# Patient Record
Sex: Female | Born: 1980 | Hispanic: No | Marital: Married | Smoking: Never smoker
Health system: Southern US, Community
[De-identification: ages and names within clinical notes are randomized; demographics above are authoritative.]

## PROBLEM LIST (undated history)

## (undated) ENCOUNTER — Inpatient Hospital Stay (HOSPITAL_COMMUNITY): Payer: Self-pay

## (undated) DIAGNOSIS — Z789 Other specified health status: Secondary | ICD-10-CM

## (undated) HISTORY — PX: NO PAST SURGERIES: SHX2092

---

## 2004-10-24 ENCOUNTER — Inpatient Hospital Stay (HOSPITAL_COMMUNITY): Admission: AD | Admit: 2004-10-24 | Discharge: 2004-10-24 | Payer: Self-pay | Admitting: Obstetrics

## 2004-10-26 ENCOUNTER — Inpatient Hospital Stay (HOSPITAL_COMMUNITY): Admission: AD | Admit: 2004-10-26 | Discharge: 2004-10-28 | Payer: Self-pay | Admitting: Obstetrics

## 2012-04-25 ENCOUNTER — Inpatient Hospital Stay (HOSPITAL_COMMUNITY): Payer: Medicaid Other

## 2012-04-25 ENCOUNTER — Encounter (HOSPITAL_COMMUNITY): Payer: Self-pay | Admitting: *Deleted

## 2012-04-25 ENCOUNTER — Inpatient Hospital Stay (HOSPITAL_COMMUNITY)
Admission: AD | Admit: 2012-04-25 | Discharge: 2012-04-25 | Disposition: A | Discharge status: Home / Self Care | Payer: Medicaid Other | Source: Ambulatory Visit | Attending: Obstetrics and Gynecology | Admitting: Obstetrics and Gynecology

## 2012-04-25 DIAGNOSIS — O039 Complete or unspecified spontaneous abortion without complication: Secondary | ICD-10-CM | POA: Insufficient documentation

## 2012-04-25 DIAGNOSIS — O2620 Pregnancy care for patient with recurrent pregnancy loss, unspecified trimester: Secondary | ICD-10-CM | POA: Diagnosis present

## 2012-04-25 HISTORY — DX: Other specified health status: Z78.9

## 2012-04-25 LAB — CBC
HCT: 34.1 % — ABNORMAL LOW (ref 36.0–46.0)
Platelets: 202 10*3/uL (ref 150–400)
RDW: 14.9 % (ref 11.5–15.5)
WBC: 7.8 10*3/uL (ref 4.0–10.5)

## 2012-04-25 LAB — HCG, QUANTITATIVE, PREGNANCY: hCG, Beta Chain, Quant, S: 3947 m[IU]/mL — ABNORMAL HIGH (ref <5)

## 2012-04-25 LAB — ABO/RH: ABO/RH(D): A POS

## 2012-04-25 NOTE — MAU Provider Note (Signed)
Chief Complaint: Vaginal Bleeding and Miscarriage   None    SUBJECTIVE HPI: Amanda Beltran is a 31 y.o. G5P3013 at [redacted]w[redacted]d by LMP who presents to maternity admissions reporting heavy vaginal bleeding with clots and abdominal cramping x1 day, with spotting x2 days.  Two days ago she was seen in office and had no FHR on U/S.  She was told to come to MAU if she had heavy bleeding or pain.  She had a miscarriage 1.5 years ago and is concerned about why this is happening again.  She denies vaginal itching/burning, urinary symptoms, h/a, dizziness, n/v, or fever/chills.    After passing large clot in MAU, pt denies pain and vaginal bleeding decreased.  Past Medical History  Diagnosis Date  . No pertinent past medical history    Past Surgical History  Procedure Date  . No past surgeries    History   Social History  . Marital Status: Married    Spouse Name: N/A    Number of Children: N/A  . Years of Education: N/A   Occupational History  . Not on file.   Social History Main Topics  . Smoking status: Never Smoker   . Smokeless tobacco: Not on file  . Alcohol Use: No  . Drug Use: No  . Sexually Active:    Other Topics Concern  . Not on file   Social History Narrative  . No narrative on file   No current facility-administered medications on file prior to encounter.   No current outpatient prescriptions on file prior to encounter.   No Known Allergies  ROS: Pertinent items in HPI  OBJECTIVE Blood pressure 103/55, pulse 62, temperature 98.2 F (36.8 C), temperature source Oral, resp. rate 16, height 5\' 3"  (1.6 m), weight 63.05 kg (139 lb), last menstrual period 01/30/2012. GENERAL: Well-developed, well-nourished female in no acute distress.  HEENT: Normocephalic HEART: normal rate RESP: normal effort ABDOMEN: Soft, non-tender EXTREMITIES: Nontender, no edema NEURO: Alert and oriented SPECULUM EXAM: Deferred--done in office on Monday Cervix 0/long/high  LAB  RESULTS Results for orders placed during the hospital encounter of 04/25/12 (from the past 24 hour(s))  CBC     Status: Abnormal   Collection Time   04/25/12  6:05 AM      Component Value Range   WBC 7.8  4.0 - 10.5 K/uL   RBC 4.15  3.87 - 5.11 MIL/uL   Hemoglobin 11.0 (*) 12.0 - 15.0 g/dL   HCT 16.1 (*) 09.6 - 04.5 %   MCV 82.2  78.0 - 100.0 fL   MCH 26.5  26.0 - 34.0 pg   MCHC 32.3  30.0 - 36.0 g/dL   RDW 40.9  81.1 - 91.4 %   Platelets 202  150 - 400 K/uL  ABO/RH     Status: Normal   Collection Time   04/25/12  6:05 AM      Component Value Range   ABO/RH(D) A POS    HCG, QUANTITATIVE, PREGNANCY     Status: Abnormal   Collection Time   04/25/12  6:05 AM      Component Value Range   hCG, Beta Chain, Quant, S 3947 (*) <5 mIU/mL    IMAGING US Ob Comp Less 14 Wks  04/25/2012  *RADIOLOGY REPORT*  Clinical Data: Bleeding.  Cramping and.  No fetal heart rate on office ultrasound.  OBSTETRIC <14 WK [place] AND TRANSVAGINAL OB US  Technique:  Both transabdominal and transvaginal ultrasound examinations were performed for complete evaluation of the  gestation as well as the maternal uterus, adnexal regions, and pelvic cul-de-sac.  Transvaginal technique was performed to assess early pregnancy.  Comparison:  None.  Intrauterine gestational sac:  Not visualized. Yolk sac: Not visualized. Embryo: Not visualized. Cardiac Activity: Not visualized.  Maternal uterus/adnexae: Endometrium measures 1.5 cm.  Complex fluid within the vagina.  The patient states she passed a large clot prior to ultrasound with pain relief.  The adnexa are visualized transabdominally and appear unremarkable.  IMPRESSION: No intrauterine gestation noted.  Findings in the proper clinical setting and laboratory setting (need to correlate with Beta HCG levels) consistent with a failed pregnancy.   Original Report Authenticated By: Lacy Duverney, M.D.    US Ob Transvaginal  04/25/2012  *RADIOLOGY REPORT*  Clinical Data: Bleeding.   Cramping and.  No fetal heart rate on office ultrasound.  OBSTETRIC <14 WK [place] AND TRANSVAGINAL OB US  Technique:  Both transabdominal and transvaginal ultrasound examinations were performed for complete evaluation of the gestation as well as the maternal uterus, adnexal regions, and pelvic cul-de-sac.  Transvaginal technique was performed to assess early pregnancy.  Comparison:  None.  Intrauterine gestational sac:  Not visualized. Yolk sac: Not visualized. Embryo: Not visualized. Cardiac Activity: Not visualized.  Maternal uterus/adnexae: Endometrium measures 1.5 cm.  Complex fluid within the vagina.  The patient states she passed a large clot prior to ultrasound with pain relief.  The adnexa are visualized transabdominally and appear unremarkable.  IMPRESSION: No intrauterine gestation noted.  Findings in the proper clinical setting and laboratory setting (need to correlate with Beta HCG levels) consistent with a failed pregnancy.   Original Report Authenticated By: Lacy Duverney, M.D.     ASSESSMENT 1. SAB (spontaneous abortion)     PLAN Called Dr Dion Body to discuss assessment and findings Large clot/products of conception sent to pathology Discharge home F/U in one week in the office Return to MAU as needed    Medication List     As of 04/25/2012  6:09 AM    ASK your doctor about these medications         ibuprofen 400 MG tablet   Commonly known as: ADVIL,MOTRIN   Take 400 mg by mouth every 6 (six) hours as needed.         Sharen Counter Certified Nurse-Midwife 04/25/2012  6:09 AM

## 2012-04-25 NOTE — MAU Note (Signed)
Pt LMP   01/30/2012, U/S 2 days ago, no fetal heart rate.  Bleeding started 1 wk ago, became heavier on Wednesday, passing clots.  Severe cramping, took ibuprofen at 0500.

## 2013-02-28 ENCOUNTER — Encounter (HOSPITAL_COMMUNITY): Payer: Self-pay | Admitting: *Deleted

## 2013-05-15 NOTE — L&D Delivery Note (Signed)
Delivery Note At 6:16 PM a viable female was delivered via  Vaginal delivery (Presentation: ; Occiput Anterior  ).  APGAR:8 , 9; weight . Pending   Placenta status: intact  , . 3 VC  Cord:  Without  complications: .  Cord pH: NA  Anesthesia: Epidural  Episiotomy: None  Lacerations:2nd degree perineal   Suture Repair: 3.0 vicryl Est. Blood Loss (mL): 300 ML  Mom to postpartum.  Baby to Couplet care / Skin to Skin.  Marcena Dias J. 01/05/2014, 6:39 PM

## 2013-06-04 ENCOUNTER — Encounter: Payer: Self-pay | Admitting: *Deleted

## 2013-06-05 ENCOUNTER — Ambulatory Visit (INDEPENDENT_AMBULATORY_CARE_PROVIDER_SITE_OTHER): Payer: BC Managed Care – PPO | Admitting: Obstetrics & Gynecology

## 2013-06-05 ENCOUNTER — Encounter: Payer: Self-pay | Admitting: Obstetrics & Gynecology

## 2013-06-05 VITALS — BP 94/60 | Temp 97.4°F | Wt 142.1 lb

## 2013-06-05 DIAGNOSIS — Z3490 Encounter for supervision of normal pregnancy, unspecified, unspecified trimester: Secondary | ICD-10-CM

## 2013-06-05 DIAGNOSIS — Z3480 Encounter for supervision of other normal pregnancy, unspecified trimester: Secondary | ICD-10-CM

## 2013-06-05 LAB — POCT URINALYSIS DIP (DEVICE)
BILIRUBIN URINE: NEGATIVE
Glucose, UA: NEGATIVE mg/dL
HGB URINE DIPSTICK: NEGATIVE
KETONES UR: NEGATIVE mg/dL
Leukocytes, UA: NEGATIVE
Nitrite: NEGATIVE
PH: 6 (ref 5.0–8.0)
PROTEIN: NEGATIVE mg/dL
SPECIFIC GRAVITY, URINE: 1.01 (ref 1.005–1.030)
Urobilinogen, UA: 0.2 mg/dL (ref 0.0–1.0)

## 2013-06-05 NOTE — Patient Instructions (Signed)
[facility] for any [facility] concerns or go to MAU for evaluation

## 2013-06-05 NOTE — Progress Notes (Signed)
Patient wants female providers only.  She was informed that this cannot be guaranteed, as we have female attending and fellows and residents.  The chances are high that she will have a female provider, but if one was not available in the hospital, a female provider will not be called in from home.  Patient is not satisfied with this, wants a practice with no female providers.  She was informed of other practices and their female providers; she wants [facility] which for now has female providers only, but cross-cover call with [facility] which is another option discussed with her (Dr. Stefano GaulStringer is only female partner).  Also talked about [facility], they only have one female attending.  [facility], we helped in making an appointment for her on 06/09/13 at 0930.   Patient was able to see fetus on bedside scan today; unable to auscultate HR with doppler.  Positive cardiac acitivity on ultrasound. Patient was reassured, she is anxious given her history of SABs.   No other complaints or concerns.  Routine obstetric precautions reviewed.

## 2013-06-05 NOTE — Progress Notes (Signed)
I assisted patient with making appt with [facility]. Appt made on Monday Jan26 at 0930.

## 2013-06-05 NOTE — Progress Notes (Signed)
P=  71, Here for initial OB visit. Not sure of date of LMP, but went to [facility] 05/25/13 for spotting - worried about miscarriage because history 2 miscarriages.  Said doctor in ER told her she was 3 months 2 days on 05/25/13 based on ultrasound there. C/o constipation sometimes. Last pap 3 years. Given new patient information . Discussed BMI/ appropriate weight gain.

## 2013-06-09 LAB — OB RESULTS CONSOLE ABO/RH: RH TYPE: POSITIVE

## 2013-06-09 LAB — OB RESULTS CONSOLE HEPATITIS B SURFACE ANTIGEN: HEP B S AG: NEGATIVE

## 2013-06-09 LAB — OB RESULTS CONSOLE HIV ANTIBODY (ROUTINE TESTING): HIV: NONREACTIVE

## 2013-06-09 LAB — OB RESULTS CONSOLE GC/CHLAMYDIA
CHLAMYDIA, DNA PROBE: NEGATIVE
Gonorrhea: NEGATIVE

## 2013-06-09 LAB — OB RESULTS CONSOLE RPR: RPR: NONREACTIVE

## 2013-06-09 LAB — OB RESULTS CONSOLE ANTIBODY SCREEN: Antibody Screen: NEGATIVE

## 2013-07-11 ENCOUNTER — Other Ambulatory Visit: Payer: Self-pay

## 2013-07-31 ENCOUNTER — Other Ambulatory Visit (HOSPITAL_COMMUNITY): Payer: Self-pay | Admitting: Obstetrics and Gynecology

## 2013-07-31 DIAGNOSIS — O280 Abnormal hematological finding on antenatal screening of mother: Secondary | ICD-10-CM

## 2013-08-01 ENCOUNTER — Ambulatory Visit (HOSPITAL_COMMUNITY)
Admission: RE | Admit: 2013-08-01 | Discharge: 2013-08-01 | Disposition: A | Discharge status: Home / Self Care | Payer: BC Managed Care – PPO | Source: Ambulatory Visit | Attending: Obstetrics and Gynecology | Admitting: Obstetrics and Gynecology

## 2013-08-01 ENCOUNTER — Encounter (HOSPITAL_COMMUNITY): Payer: Self-pay

## 2013-08-01 ENCOUNTER — Other Ambulatory Visit: Payer: Self-pay

## 2013-08-01 DIAGNOSIS — O289 Unspecified abnormal findings on antenatal screening of mother: Secondary | ICD-10-CM | POA: Insufficient documentation

## 2013-08-01 DIAGNOSIS — IMO0002 Reserved for concepts with insufficient information to code with codable children: Secondary | ICD-10-CM | POA: Insufficient documentation

## 2013-08-01 DIAGNOSIS — O3510X0 Maternal care for (suspected) chromosomal abnormality in fetus, unspecified, not applicable or unspecified: Secondary | ICD-10-CM | POA: Insufficient documentation

## 2013-08-01 DIAGNOSIS — O351XX0 Maternal care for (suspected) chromosomal abnormality in fetus, not applicable or unspecified: Secondary | ICD-10-CM | POA: Insufficient documentation

## 2013-08-01 DIAGNOSIS — O280 Abnormal hematological finding on antenatal screening of mother: Secondary | ICD-10-CM

## 2013-08-01 NOTE — Progress Notes (Signed)
Genetic Counseling  High-Risk Gestation Note  Appointment Date:  08/01/2013 Referred By: Jessee Aversole, Tara J., MD Date of Birth:  01/30/1981 Partner:  Penni HomansAyaz Ahmed   Pregnancy History: [id number]: G7P3033 Estimated Date of Delivery: 01/02/14 Estimated Gestational Age: 5535w0d Attending: Particia Nearingecker, Martha, MD   Mrs. Annice PihShabeena Goodhart and her husband, Mr. Penni Homansyaz Ahmed, were seen for genetic counseling because of an increased risk for fetal Down syndrome based on maternal serum Quad screen through [facility].  They were counseled regarding the Quad screen result and the associated 1 in 32 risk for fetal Down syndrome.  We reviewed chromosomes, nondisjunction, and the common features and variable prognosis of Down syndrome.  In addition, we reviewed the screen adjusted reduction in risks for trisomy 18 and ONTDs.  We also discussed other explanations for a screen positive result including: a gestational dating error, differences in maternal metabolism, and normal variation. They understand that this screening is not diagnostic for Down syndrome.  Mrs. Rana SnareKhan's Quad screen result was calculated using an EDC of 12/24/13 based on her LMP.  Given that Mrs. Welton FlakesKhan had 8 week ([facility]) and 12 week [facility] ultrasounds, which were consistent with an EDC of 01/02/14, the result was recalculated.  An addended report was sent to the referring provider.  The recalculated Quad screen was still screen positive for Down syndrome, with a risk of 1 in 166.  The patient and her husband were counseled regarding this recalculated result.    We reviewed available screening options including noninvasive prenatal screening (NIPS)/cell free fetal DNA (cffDNA) testing, and detailed ultrasound.  They were counseled that screening tests are used to modify a patient's a priori risk for aneuploidy. This estimate provides a pregnancy specific risk assessment. We reviewed the benefits and limitations of each option. Specifically, we discussed the conditions for which  each test screens, the detection rates, and false positive rates of each. They were also counseled regarding diagnostic testing via amniocentesis. We reviewed the approximate 1 in 300-500 risk for complications for amniocentesis, including spontaneous pregnancy loss.  After consideration of all the options, they elected to proceed with NIPS.  Those results will be available in 8-10 days.  The patient also expressed interest in having a detailed ultrasound.  A complete ultrasound was performed today. The ultrasound report will be sent under separate cover. There were no visualized fetal anomalies or markers suggestive of aneuploidy. Diagnostic testing was declined today.  They understand that screening tests cannot rule out all birth defects or genetic syndromes. The patient was advised of this limitation and states she still does not want additional testing at this time.   Considering this couple's Asian ancestry, we discussed the increased risk for hemoglobinopathies.  We reviewed the incidence of specific hemoglobinopathies and thalassemias, the availability of carrier testing, and prenatal diagnosis if indicated.  In addition, we discussed that hemoglobinopathies are routinely screened for as part of the [place] newborn screening panel.  This couple wishes to talk to their primary provider more about this testing option.     Both family histories were reviewed and found to be noncontributory for birth defects, intellectual disability, and known genetic conditions. Without further information regarding the provided family history, an accurate genetic risk cannot be calculated. Further genetic counseling is warranted if more information is obtained.  Mrs. Welton FlakesKhan denied exposure to environmental toxins or chemical agents. She denied the use of alcohol, tobacco or street drugs. She denied significant viral illnesses during the course of her pregnancy.   I counseled  this couple for approximately 35 minutes regarding  the above risks and available options.   Despina Arias, MS Certified Genetic Counselor

## 2013-08-13 ENCOUNTER — Telehealth (HOSPITAL_COMMUNITY): Payer: Self-pay

## 2013-08-13 NOTE — Telephone Encounter (Signed)
Called Amanda Beltran Cuellar to discuss her cell free fetal DNA test results.  Mrs. Amanda Beltran had Panorama testing through [facility].  Testing was offered because of an abnl Quad screen result.   The patient was identified by name and DOB.  We reviewed that these are within normal limits, showing a less than 1 in 10,000 risk for trisomies 21, 18 and 13, and monosomy X (Turner syndrome).  In addition, the risk for triploidy/vanishing twin and sex chromosome trisomies (47,XXX and 47,XXY) was also low risk.  We reviewed that this testing identifies > 99% of pregnancies with trisomy 7821, trisomy 3313, sex chromosome trisomies (47,XXX and 47,XXY), and triploidy. The detection rate for trisomy 18 is 96%.  The detection rate for monosomy X is ~92%.  The false positive rate is <0.1% for all conditions. Testing was also consistent with female gender.  The patient did not wish to know gender.  She understands that this testing does not identify all genetic conditions.  All questions were answered to her satisfaction, she was encouraged to call with additional questions or concerns.  Despina AriasSTANLEY, Berthe Oley, MS Certified Genetic Counselor

## 2013-12-09 LAB — OB RESULTS CONSOLE GBS: GBS: NEGATIVE

## 2014-01-02 ENCOUNTER — Telehealth (HOSPITAL_COMMUNITY): Payer: Self-pay | Admitting: *Deleted

## 2014-01-02 NOTE — Telephone Encounter (Signed)
Preadmission screen  

## 2014-01-05 ENCOUNTER — Encounter (HOSPITAL_COMMUNITY): Payer: BC Managed Care – PPO | Admitting: Anesthesiology

## 2014-01-05 ENCOUNTER — Inpatient Hospital Stay (HOSPITAL_COMMUNITY)
Admission: RE | Admit: 2014-01-05 | Discharge: 2014-01-07 | DRG: 775 | Disposition: A | Discharge status: Home / Self Care | Payer: BC Managed Care – PPO | Source: Ambulatory Visit | Attending: Obstetrics and Gynecology | Admitting: Obstetrics and Gynecology

## 2014-01-05 ENCOUNTER — Inpatient Hospital Stay (HOSPITAL_COMMUNITY): Payer: BC Managed Care – PPO | Admitting: Anesthesiology

## 2014-01-05 ENCOUNTER — Encounter (HOSPITAL_COMMUNITY): Payer: Self-pay

## 2014-01-05 DIAGNOSIS — D649 Anemia, unspecified: Secondary | ICD-10-CM | POA: Diagnosis present

## 2014-01-05 DIAGNOSIS — O9902 Anemia complicating childbirth: Secondary | ICD-10-CM | POA: Diagnosis present

## 2014-01-05 DIAGNOSIS — O289 Unspecified abnormal findings on antenatal screening of mother: Secondary | ICD-10-CM

## 2014-01-05 DIAGNOSIS — O480 Post-term pregnancy: Principal | ICD-10-CM | POA: Diagnosis present

## 2014-01-05 DIAGNOSIS — O2621 Pregnancy care for patient with recurrent pregnancy loss, first trimester: Secondary | ICD-10-CM

## 2014-01-05 LAB — CBC
HCT: 30.8 % — ABNORMAL LOW (ref 36.0–46.0)
HEMOGLOBIN: 9.7 g/dL — AB (ref 12.0–15.0)
MCH: 24.4 pg — AB (ref 26.0–34.0)
MCHC: 31.5 g/dL (ref 30.0–36.0)
MCV: 77.4 fL — AB (ref 78.0–100.0)
PLATELETS: 181 10*3/uL (ref 150–400)
RBC: 3.98 MIL/uL (ref 3.87–5.11)
RDW: 15.1 % (ref 11.5–15.5)
WBC: 7.1 10*3/uL (ref 4.0–10.5)

## 2014-01-05 LAB — RPR

## 2014-01-05 LAB — TYPE AND SCREEN
ABO/RH(D): A POS
Antibody Screen: NEGATIVE

## 2014-01-05 MED ORDER — FENTANYL 2.5 MCG/ML BUPIVACAINE 1/10 % EPIDURAL INFUSION (WH - ANES)
14.0000 mL/h | INTRAMUSCULAR | Status: DC | PRN
  Administered 2014-01-05 (×2): 14 mL/h via EPIDURAL
  Filled 2014-01-05: qty 125

## 2014-01-05 MED ORDER — OXYCODONE-ACETAMINOPHEN 5-325 MG PO TABS
1.0000 | ORAL_TABLET | ORAL | Status: DC | PRN
  Administered 2014-01-06 (×2): 1 via ORAL
  Filled 2014-01-05 (×2): qty 1

## 2014-01-05 MED ORDER — OXYTOCIN 40 UNITS IN LACTATED RINGERS INFUSION - SIMPLE MED
1.0000 m[IU]/min | INTRAVENOUS | Status: DC
  Administered 2014-01-05: 2 m[IU]/min via INTRAVENOUS
  Filled 2014-01-05: qty 1000

## 2014-01-05 MED ORDER — OXYTOCIN BOLUS FROM INFUSION: 500.0000 mL | INTRAVENOUS | Status: DC

## 2014-01-05 MED ORDER — IBUPROFEN 600 MG PO TABS: 600.0000 mg | ORAL_TABLET | Freq: Four times a day (QID) | ORAL | Status: DC | PRN

## 2014-01-05 MED ORDER — BENZOCAINE-MENTHOL 20-0.5 % EX AERO
1.0000 "application " | INHALATION_SPRAY | CUTANEOUS | Status: DC | PRN
  Administered 2014-01-05: 1 via TOPICAL
  Filled 2014-01-05: qty 56

## 2014-01-05 MED ORDER — PHENYLEPHRINE 40 MCG/ML (10ML) SYRINGE FOR IV PUSH (FOR BLOOD PRESSURE SUPPORT)
80.0000 ug | PREFILLED_SYRINGE | INTRAVENOUS | Status: DC | PRN
  Administered 2014-01-05: 80 ug via INTRAVENOUS
  Filled 2014-01-05: qty 2

## 2014-01-05 MED ORDER — TERBUTALINE SULFATE 1 MG/ML IJ SOLN: 0.2500 mg | Freq: Once | INTRAMUSCULAR | Status: DC | PRN

## 2014-01-05 MED ORDER — PRENATAL MULTIVITAMIN CH
1.0000 | ORAL_TABLET | Freq: Every day | ORAL | Status: DC
  Administered 2014-01-06 – 2014-01-07 (×2): 1 via ORAL
  Filled 2014-01-05 (×2): qty 1

## 2014-01-05 MED ORDER — LACTATED RINGERS IV SOLN: 500.0000 mL | Freq: Once | INTRAVENOUS | Status: DC

## 2014-01-05 MED ORDER — OXYTOCIN 40 UNITS IN LACTATED RINGERS INFUSION - SIMPLE MED: 62.5000 mL/h | INTRAVENOUS | Status: DC

## 2014-01-05 MED ORDER — FERROUS SULFATE 325 (65 FE) MG PO TABS
325.0000 mg | ORAL_TABLET | Freq: Two times a day (BID) | ORAL | Status: DC
  Administered 2014-01-06 – 2014-01-07 (×2): 325 mg via ORAL
  Filled 2014-01-05 (×2): qty 1

## 2014-01-05 MED ORDER — IBUPROFEN 600 MG PO TABS
600.0000 mg | ORAL_TABLET | Freq: Four times a day (QID) | ORAL | Status: DC
  Administered 2014-01-05 – 2014-01-07 (×6): 600 mg via ORAL
  Filled 2014-01-05 (×7): qty 1

## 2014-01-05 MED ORDER — PHENYLEPHRINE 40 MCG/ML (10ML) SYRINGE FOR IV PUSH (FOR BLOOD PRESSURE SUPPORT)
PREFILLED_SYRINGE | INTRAVENOUS | Status: AC
  Filled 2014-01-05: qty 10

## 2014-01-05 MED ORDER — FLEET ENEMA 7-19 GM/118ML RE ENEM: 1.0000 | ENEMA | RECTAL | Status: DC | PRN

## 2014-01-05 MED ORDER — ACETAMINOPHEN 325 MG PO TABS: 650.0000 mg | ORAL_TABLET | ORAL | Status: DC | PRN

## 2014-01-05 MED ORDER — FENTANYL 2.5 MCG/ML BUPIVACAINE 1/10 % EPIDURAL INFUSION (WH - ANES)
INTRAMUSCULAR | Status: AC
  Administered 2014-01-05: 12:00:00
  Filled 2014-01-05: qty 125

## 2014-01-05 MED ORDER — LIDOCAINE HCL (PF) 1 % IJ SOLN
30.0000 mL | INTRAMUSCULAR | Status: DC | PRN
  Filled 2014-01-05: qty 30

## 2014-01-05 MED ORDER — DIBUCAINE 1 % RE OINT: 1.0000 "application " | TOPICAL_OINTMENT | RECTAL | Status: DC | PRN

## 2014-01-05 MED ORDER — LACTATED RINGERS IV SOLN: 500.0000 mL | INTRAVENOUS | Status: DC | PRN

## 2014-01-05 MED ORDER — ZOLPIDEM TARTRATE 5 MG PO TABS: 5.0000 mg | ORAL_TABLET | Freq: Every evening | ORAL | Status: DC | PRN

## 2014-01-05 MED ORDER — METHYLERGONOVINE MALEATE 0.2 MG PO TABS: 0.2000 mg | ORAL_TABLET | ORAL | Status: DC | PRN

## 2014-01-05 MED ORDER — PHENYLEPHRINE 40 MCG/ML (10ML) SYRINGE FOR IV PUSH (FOR BLOOD PRESSURE SUPPORT)
80.0000 ug | PREFILLED_SYRINGE | INTRAVENOUS | Status: DC | PRN
  Filled 2014-01-05: qty 2

## 2014-01-05 MED ORDER — LACTATED RINGERS IV SOLN
INTRAVENOUS | Status: DC
  Administered 2014-01-05: 16:00:00 via INTRAVENOUS
  Administered 2014-01-05: 125 mL via INTRAVENOUS

## 2014-01-05 MED ORDER — ONDANSETRON HCL 4 MG/2ML IJ SOLN
4.0000 mg | Freq: Four times a day (QID) | INTRAMUSCULAR | Status: DC | PRN
  Administered 2014-01-05: 4 mg via INTRAVENOUS
  Filled 2014-01-05: qty 2

## 2014-01-05 MED ORDER — EPHEDRINE 5 MG/ML INJ
10.0000 mg | INTRAVENOUS | Status: DC | PRN
  Filled 2014-01-05: qty 2

## 2014-01-05 MED ORDER — SENNOSIDES-DOCUSATE SODIUM 8.6-50 MG PO TABS
2.0000 | ORAL_TABLET | ORAL | Status: DC
  Administered 2014-01-06 – 2014-01-07 (×2): 2 via ORAL
  Filled 2014-01-05 (×2): qty 2

## 2014-01-05 MED ORDER — CITRIC ACID-SODIUM CITRATE 334-500 MG/5ML PO SOLN: 30.0000 mL | ORAL | Status: DC | PRN

## 2014-01-05 MED ORDER — LANOLIN HYDROUS EX OINT
TOPICAL_OINTMENT | CUTANEOUS | Status: DC | PRN
Start: 2014-01-05 — End: 2014-01-07

## 2014-01-05 MED ORDER — DIPHENHYDRAMINE HCL 25 MG PO CAPS
25.0000 mg | ORAL_CAPSULE | Freq: Four times a day (QID) | ORAL | Status: DC | PRN
Start: 2014-01-05 — End: 2014-01-07

## 2014-01-05 MED ORDER — OXYCODONE-ACETAMINOPHEN 5-325 MG PO TABS: 1.0000 | ORAL_TABLET | ORAL | Status: DC | PRN

## 2014-01-05 MED ORDER — ONDANSETRON HCL 4 MG/2ML IJ SOLN: 4.0000 mg | INTRAMUSCULAR | Status: DC | PRN

## 2014-01-05 MED ORDER — ONDANSETRON HCL 4 MG PO TABS: 4.0000 mg | ORAL_TABLET | ORAL | Status: DC | PRN

## 2014-01-05 MED ORDER — SIMETHICONE 80 MG PO CHEW: 80.0000 mg | CHEWABLE_TABLET | ORAL | Status: DC | PRN

## 2014-01-05 MED ORDER — DIPHENHYDRAMINE HCL 50 MG/ML IJ SOLN: 12.5000 mg | INTRAMUSCULAR | Status: DC | PRN

## 2014-01-05 MED ORDER — METHYLERGONOVINE MALEATE 0.2 MG/ML IJ SOLN: 0.2000 mg | INTRAMUSCULAR | Status: DC | PRN

## 2014-01-05 MED ORDER — LIDOCAINE HCL (PF) 1 % IJ SOLN
INTRAMUSCULAR | Status: DC | PRN
  Administered 2014-01-05 (×3): 4 mL

## 2014-01-05 MED ORDER — WITCH HAZEL-GLYCERIN EX PADS
1.0000 | MEDICATED_PAD | CUTANEOUS | Status: DC | PRN
Start: 2014-01-05 — End: 2014-01-07
  Administered 2014-01-05: 1 via TOPICAL

## 2014-01-05 NOTE — Progress Notes (Signed)
Amanda Beltran is a 33 y.o. [id number] at [redacted]w[redacted]d by 12 week  ultrasound admitted for induction of labor due to Post dates. Due date 09/02/2013.  Subjective: Patient is comfortable with her epidural \  Objective: BP 104/66  Pulse 67  Temp(Src) 98.3 F (36.8 C) (Oral)  Resp 18  Ht  (1.6 m)  Wt 74.844 kg (165 lb)  BMI 29.24 kg/m2  SpO2 98%      FHT:  Baseline 120 moderate variability. Accelerations present. Variable decelerations  UC:   regular, every 2 minutes SVE:   Dilation: 8 Effacement (%): 100 Station: 0 Exam by:: [employer]: Lab Results  Component Value Date   WBC 7.1 01/05/2014   HGB 9.7* 01/05/2014   HCT 30.8* 01/05/2014   MCV 77.4* 01/05/2014   PLT 181 01/05/2014    Assessment / Plan: Induction of labor due to postterm,  progressing well on pitocin  Labor: Progressing on Pitocin Preeclampsia:  NA Fetal Wellbeing:  Category I Pain Control:  Epidural I/D:  n/a Anticipated MOD:  NSVD  Eryx Zane J. 01/05/2014, 4:51 PM

## 2014-01-05 NOTE — Anesthesia Preprocedure Evaluation (Signed)
Anesthesia Evaluation  Patient identified by MRN, date of birth, ID band Patient awake    Reviewed: Allergy & Precautions, H&P , NPO status , Patient's Chart, lab work & pertinent test results, reviewed documented beta blocker date and time   History of Anesthesia Complications Negative for: history of anesthetic complications  Airway Mallampati: I TM Distance: >3 FB Neck ROM: full    Dental  (+) Teeth Intact   Pulmonary neg pulmonary ROS,  breath sounds clear to auscultation        Cardiovascular negative cardio ROS  Rhythm:regular Rate:Normal     Neuro/Psych negative neurological ROS  negative psych ROS   GI/Hepatic negative GI ROS, Neg liver ROS,   Endo/Other  negative endocrine ROS  Renal/GU negative Renal ROS     Musculoskeletal   Abdominal   Peds  Hematology  (+) anemia ,   Anesthesia Other Findings   Reproductive/Obstetrics (+) Pregnancy                           Anesthesia Physical Anesthesia Plan  ASA: II  Anesthesia Plan: Epidural   Post-op Pain Management:    Induction:   Airway Management Planned:   Additional Equipment:   Intra-op Plan:   Post-operative Plan:   Informed Consent: I have reviewed the patients History and Physical, chart, labs and discussed the procedure including the risks, benefits and alternatives for the proposed anesthesia with the patient or authorized representative who has indicated his/her understanding and acceptance.     Plan Discussed with:   Anesthesia Plan Comments:         Anesthesia Quick Evaluation

## 2014-01-05 NOTE — H&P (Signed)
Amanda Beltran is a 33 y.o. female presenting for induction at 39 wks and 2 day. Pregnancy has been uncomplicated.   Marland Kitchen History OB History   Grav Para Term Preterm Abortions TAB SAB Ect Mult Living   Past Medical History  Diagnosis Date  . No pertinent past medical history    Past Surgical History  Procedure Laterality Date  . No past surgeries     Family History: family history is not on file. Social History:  reports that she has never smoked. She has never used smokeless tobacco. She reports that she does not drink alcohol or use illicit drugs.   Prenatal Transfer Tool  Maternal Diabetes: No Genetic Screening: Abnormal:  Results: Elevated risk of Trisomy 21 Maternal Ultrasounds/Referrals: Normal Fetal Ultrasounds or other Referrals:  Other:  Maternal Substance Abuse:  No Significant Maternal Medications:  None Significant Maternal Lab Results:  Lab values include: Group B Strep negative Other Comments:  None  Review of Systems  Constitutional: Negative.   HENT: Negative.   Eyes: Negative.   Respiratory: Negative.   Cardiovascular: Negative.   Gastrointestinal: Negative.   Genitourinary: Negative.   Musculoskeletal: Negative.   Skin: Negative.   Neurological: Negative.   Endo/Heme/Allergies: Negative.   Psychiatric/Behavioral: Negative.   All other systems reviewed and are negative.   Dilation: 3 Effacement (%): 80 Station: -2 Exam by:: Dr Richardson Dopp Blood pressure 94/48, pulse 63, temperature 98 F (36.7 C), temperature source Oral, resp. rate 18, height  (1.6 m), weight 74.844 kg (165 lb), SpO2 98.00%, unknown if currently breastfeeding. Maternal Exam:  Uterine Assessment: Contraction strength is moderate.  Contraction frequency is regular.   Abdomen: Patient reports no abdominal tenderness. Fetal presentation: vertex  Introitus: Normal vulva. Normal vagina.    Fetal Exam Fetal Monitor Review: Mode: fetoscope.   Baseline rate: 140.   Variability: moderate (6-25 bpm).   Pattern: accelerations present and no decelerations.    Fetal State Assessment: Category I - tracings are normal.     Physical Exam  Constitutional: She is oriented to person, place, and time. She appears well-developed and well-nourished.  HENT:  Head: Normocephalic and atraumatic.  Neck: Normal range of motion.  Cardiovascular: Normal rate and regular rhythm.   Respiratory: Effort normal.  Genitourinary: Vagina normal.  Musculoskeletal: Normal range of motion.  Neurological: She is alert and oriented to person, place, and time.  Skin: Skin is warm and dry.  Psychiatric: She has a normal mood and affect.      Cervic 3.5 /80/-2 Clear fluid IUPC placed  Prenatal labs: ABO, Rh: A/Positive/-- (01/26 0000) Antibody: Negative (01/26 0000) Rubella:  Immune  RPR: Nonreactive (01/26 0000)  HBsAg: Negative (01/26 0000)  HIV: Non-reactive (01/26 0000)  GBS: Negative (07/28 0000)   Assessment/Plan: 40 wks and 3 days for induction . Pregnancy uncomplicated... Pt desires elective induction. Pt informed of increased r/o cesarean section associated with induction. She accepts this risk and desires to proceed.  Continue pitocin  SROM  Clear fluid time unknown. Anticipate SVD    Colin Ellers J. 01/05/2014, 3:26 PM

## 2014-01-05 NOTE — Anesthesia Procedure Notes (Signed)
Epidural Patient location during procedure: OB Start time: 01/05/2014 12:24 PM  Staffing Anesthesiologist: Mustafa Potts Performed by: anesthesiologist   Preanesthetic Checklist Completed: patient identified, site marked, surgical consent, pre-op evaluation, timeout performed, IV checked, risks and benefits discussed and monitors and equipment checked  Epidural Patient position: sitting Prep: site prepped and draped and DuraPrep Patient monitoring: continuous pulse ox and blood pressure Approach: midline Location: L3-L4 Injection technique: LOR air  Needle:  Needle type: Tuohy  Needle gauge: 17 G Needle length: 9 cm and 9 Needle insertion depth: 5 cm cm Catheter type: closed end flexible Catheter size: 19 Gauge Catheter at skin depth: 10 cm Test dose: negative  Assessment Events: blood not aspirated, injection not painful, no injection resistance, negative IV test and no paresthesia  Additional Notes Discussed risk of headache, infection, bleeding, nerve injury and failed or incomplete block.  Patient voices understanding and wishes to proceed.  Epidural placed easily on first attempt.  No paresthesia.  Patient tolerated procedure well with no apparent complications.  Jasmine December, MDReason for block:procedure for pain

## 2014-01-06 LAB — CBC
HCT: 31.1 % — ABNORMAL LOW (ref 36.0–46.0)
Hemoglobin: 9.5 g/dL — ABNORMAL LOW (ref 12.0–15.0)
MCH: 23.7 pg — ABNORMAL LOW (ref 26.0–34.0)
MCHC: 30.5 g/dL (ref 30.0–36.0)
MCV: 77.6 fL — ABNORMAL LOW (ref 78.0–100.0)
PLATELETS: 161 10*3/uL (ref 150–400)
RBC: 4.01 MIL/uL (ref 3.87–5.11)
RDW: 15.2 % (ref 11.5–15.5)
WBC: 10.3 10*3/uL (ref 4.0–10.5)

## 2014-01-06 NOTE — Progress Notes (Signed)
Patient asked to take Ibuprofen after she eats her dinner.  Patient will call when she is ready.  "Pain is better now".

## 2014-01-06 NOTE — Anesthesia Postprocedure Evaluation (Signed)
Anesthesia Post Note  Patient: Amanda Beltran  Procedure(s) Performed: * No procedures listed *  Anesthesia type: Epidural  Patient location: Mother/Baby  Post pain: Pain level controlled  Post assessment: Post-op Vital signs reviewed  Last Vitals:  Filed Vitals:   01/06/14 0213  BP: 103/47  Pulse: 70  Temp: 37.2 C  Resp: 18    Post vital signs: Reviewed  Level of consciousness:alert  Complications: No apparent anesthesia complications

## 2014-01-06 NOTE — Progress Notes (Signed)
Patient up to bathroom without difficulty and voided. States feels slightly dizzy with ambulation. Instructed to call for assistance.

## 2014-01-06 NOTE — Progress Notes (Signed)
Post Partum Day 1 Subjective: no complaints, up ad lib, voiding and tolerating PO  Objective: Blood pressure 103/47, pulse 70, temperature 98.9 F (37.2 C), temperature source Oral, resp. rate 18, height  (1.6 m), weight 74.844 kg (165 lb), SpO2 98.00%, unknown if currently breastfeeding.  Physical Exam:  General: alert and cooperative Lochia: appropriate Uterine Fundus: firm Incision: NA DVT Evaluation: No evidence of DVT seen on physical exam.   Recent Labs  01/05/14 0715 01/06/14 0620  HGB 9.7* 9.5*  HCT 30.8* 31.1*    Assessment/Plan: Plan for discharge tomorrow and Breastfeeding Pt desires circumcision will await pediatricians evaluation   LOS: 1 day   Erlinda Solinger J. 01/06/2014, 1:49 PM

## 2014-01-07 ENCOUNTER — Ambulatory Visit: Payer: Self-pay

## 2014-01-07 MED ORDER — IBUPROFEN 600 MG PO TABS: 600.0000 mg | ORAL_TABLET | Freq: Four times a day (QID) | ORAL | Status: AC | PRN

## 2014-01-07 NOTE — Lactation Note (Signed)
This note was copied from the chart of Amanda Beltran. Lactation Consultation Note  Patient Name: Amanda Beltran [id number] Date: 01/07/2014 Reason for consult: Follow-up assessment;Breast/nipple pain Mom c/o of sore nipples, requested 2nd pair of comfort gels. LC reviewed care for sore nipples and advised Mom if this continues to call and schedule OP f/u. Baby in car seat and parents ready to go home. Did not see latch. Mom reports she feels baby is latching well, hearing some swallows. Mom is experienced BF.  Engorgement care reviewed if needed. Advised of OP services and support group. Cluster feeding reviewed.   Maternal Data    Feeding    LATCH Score/Interventions                      Lactation Tools Discussed/Used Tools: Comfort gels   Consult Status Consult Status: Complete Date: 01/07/14 Follow-up type: In-patient    Alfred Levins 01/07/2014, 12:55 PM

## 2014-01-07 NOTE — Discharge Summary (Signed)
Obstetric Discharge Summary Reason for Admission: induction of labor Prenatal Procedures: none Intrapartum Procedures: spontaneous vaginal delivery Postpartum Procedures: none Complications-Operative and Postpartum: 1st degree perineal laceration Hemoglobin  Date Value Ref Range Status  01/06/2014 9.5* 12.0 - 15.0 g/dL Final     HCT  Date Value Ref Range Status  01/06/2014 31.1* 36.0 - 46.0 % Final    Physical Exam:  General: alert and cooperative Lochia: appropriate Uterine Fundus: firm Incision: NA DVT Evaluation: No evidence of DVT seen on physical exam.  Discharge Diagnoses: Term Pregnancy-delivered  Discharge Information: Date: 01/07/2014 Activity: pelvic rest Diet: routine Medications: PNV and Ibuprofen Condition: stable Instructions: refer to practice specific booklet Discharge to: home Follow-up Information   Follow up with Jessee Avers., MD. Schedule an appointment as soon as possible for a visit in 6 weeks. (postpartum visit )    Specialty:  Obstetrics and Gynecology   Contact information:   [address] [place] [address] [phone]       Newborn Data: Live born female  Birth Weight: 8 lb 2 oz (3685 g) APGAR: 9, 9  Home with mother.  Zan Triska J. 01/07/2014, 8:38 AM

## 2014-03-16 ENCOUNTER — Encounter (HOSPITAL_COMMUNITY): Payer: Self-pay

## 2017-01-01 ENCOUNTER — Ambulatory Visit: Payer: Self-pay | Admitting: Allergy

## 2017-02-21 ENCOUNTER — Ambulatory Visit (INDEPENDENT_AMBULATORY_CARE_PROVIDER_SITE_OTHER): Payer: BLUE CROSS/BLUE SHIELD | Admitting: Allergy

## 2017-02-21 ENCOUNTER — Encounter: Payer: Self-pay | Admitting: Allergy

## 2017-02-21 ENCOUNTER — Telehealth: Payer: Self-pay | Admitting: Family Medicine

## 2017-02-21 VITALS — BP 110/70 | HR 67 | Temp 97.8°F | Resp 16 | Ht 62.99 in | Wt 167.8 lb

## 2017-02-21 DIAGNOSIS — T7800XA Anaphylactic reaction due to unspecified food, initial encounter: Secondary | ICD-10-CM | POA: Diagnosis not present

## 2017-02-21 DIAGNOSIS — L209 Atopic dermatitis, unspecified: Secondary | ICD-10-CM | POA: Diagnosis not present

## 2017-02-21 DIAGNOSIS — J301 Allergic rhinitis due to pollen: Secondary | ICD-10-CM | POA: Diagnosis not present

## 2017-02-21 DIAGNOSIS — E038 Other specified hypothyroidism: Secondary | ICD-10-CM | POA: Diagnosis not present

## 2017-02-21 MED ORDER — EPINEPHRINE 0.3 MG/0.3ML IJ SOAJ: 0.3000 mg | Freq: Once | INTRAMUSCULAR | 2 refills | Status: AC

## 2017-02-21 NOTE — Patient Instructions (Addendum)
1. Seasonal allergic rhinitis due to pollen Pollen avoidence information provided Control of Mold Allergen  Mold and fungi can grow on a variety of surfaces provided certain temperature and moisture conditions exist.  Outdoor molds grow on plants, decaying vegetation and soil.  The major outdoor mold, Alternaria and Cladosporium, are found in very high numbers during hot and dry conditions.  Generally, a late Summer - Fall peak is seen for common outdoor fungal spores.  Rain will temporarily lower outdoor mold spore count, but counts rise rapidly when the rainy period ends.  The most important indoor molds are Aspergillus and Penicillium.  Dark, humid and poorly ventilated basements are ideal sites for mold growth.  The next most common sites of mold growth are the bathroom and the kitchen.  Outdoor [employer] 1. Use air conditioning and keep windows closed 2. Avoid exposure to decaying vegetation. 3. Avoid leaf raking. 4. Avoid grain handling. 5. Consider wearing a face mask if working in moldy areas.  Indoor Mold Control 1. Maintain humidity below 50%. 2. Clean washable surfaces with 5% bleach solution. 3. Remove sources e.g. Contaminated carpets.  Continue Claritin as needed for runny nose   2. Anaphylactic shock due to food, initial encounter Carry EpiPen for life threatening allergic reaction An action plan has been provided in case of life threatening allergic reaction  3. Atopic dermatitis, unspecified type Return for blood testing for foods identified today Return on a Monday for patch placement and return for reading on Wednesday and Friday of that same week. Continue to use triamcinolone cream as needed for itching May eliminate the following foods from your diet for 1 week to see if your skin clears. Wheat, rice, barley, walnut, almond, hazelnut, and cashew.  4. Other specified hypothyroidism Continue to follow up with primary care provider for thyroid issues

## 2017-02-21 NOTE — Addendum Note (Signed)
Addended by: Hetty Blend on: 02/21/2017 01:52 PM   Modules accepted: Orders

## 2017-02-21 NOTE — Progress Notes (Signed)
[address] [place] [address] Dept: [phone]  FAMILY NURSE PRACTITIONER NEW PATIENT NOTE  Patient ID: Amanda Beltran, female    DOB: 1981-05-02  Age: 36 y.o. MRN: [id number] Date of Office Visit: 02/21/2017 Referring provider: Lina Sayre, MD [address] [place], [place] [address]    Chief Complaint: Eczema and Rash (patient thinks that she is allergic to wheat flour and chick pea. she states that her thorat starts to itch )  HPI Amanda Beltran presents for a pruritic rash that began on both of her feet in 2001 when she moved from [place] to the [place] in [place], [place]. At that time she could not identify any food or environmental triggers. In 2005 she moved to [place] and she noted that her skin rashes went through periods of flaring and remission. She reports in 2010 she had a skin biopsy that she reports as negative. 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 She continues to use triamcinolone cream on an as needed basis about 2-3 times a week.The rash begins as a "pimple" which is very itchy and slowly progressive. She reports the rash is worse in the summer and winter and has also gone for several years with no rash at all. 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 She has identified several foods that she eats in her diet that she thinks may be the cause of her skin eruptions including chick peas, wheat, and red meats. She does not have a history of asthma and has no current symptoms of asthma. She reports a stuffy nose which is worse in the summer and winter months. She reports a history of thyroid issues for which she is closely followed by her primary care provider and eczema that began as a child that resolved and  flares.  Family history is vague but negative for allergies and eczema.   Review of Systems  Constitutional: Negative.   HENT: Negative.   Eyes: Negative.        Eyes itchy from time to time. Worse in the summer and winter months.  Respiratory: Negative.   Cardiovascular: Negative.   Gastrointestinal: Negative.   Musculoskeletal: Negative.   Skin:       Skin rash that flares and resolves on both feet, arms, and trunk.   Neurological: Negative.   Endo/Heme/Allergies:       Reports thyroid issues. Hyperthyroidism, thyroid nodule, followed by normal thyroid levels.   Psychiatric/Behavioral: Negative.     Outpatient Encounter Prescriptions as of 02/21/2017  Medication Sig  . betamethasone valerate ointment (VALISONE) 0.1 % Use twice daily for two weeks on affected areas  . ketoconazole (NIZORAL) 2 % cream Once per day under breast for 2-4 weeks  . ibuprofen (ADVIL,MOTRIN) 600 MG tablet Take 1 tablet (600 mg total) by mouth every 6 (six) hours as needed. (Patient not taking: Reported on 02/21/2017)  . Prenatal Vit-Fe Fumarate-FA (PRENATAL VITAMINS PLUS) 27-1 MG TABS Take 1 tablet by mouth daily.   No facility-administered encounter medications on file as of 02/21/2017.      Drug Allergies:  Allergies  Allergen Reactions  . Other Shortness Of Breath    Nuts and chickpeas  . Wheat Extract     Family History: Amanda Beltran's family history is: mother with no reported medical problems including  no eczema or allergies. Father with no reported medical problems including eczema or allergies. .  Environmental history: She lives in a home with carpeting with gas heating and central cooling. There is a dog in the home. There is no concern for water damage, mildew or roaches in the home. She has no smoking history.    Physical Exam: BP 110/70 (BP Location: Right Arm, Cuff Size: Normal)   Pulse 67   Temp 97.8 F (36.6 C) (Oral)   Resp 16   Ht 5' 2.99" (1.6 m)   Wt 167 lb 12.8 oz (76.1 kg)    SpO2 97%   BMI 29.73 kg/m    Physical Exam  Constitutional: She is oriented to person, place, and time. She appears well-developed and well-nourished.  HENT:  Ears normal, nose normal, eyes normal, pharynx normal.  Eyes: Conjunctivae are normal.  Neck: Normal range of motion. Neck supple.  Cardiovascular: Normal rate, regular rhythm and normal heart sounds.   Pulmonary/Chest: Effort normal and breath sounds normal.  Abdominal: Soft. Bowel sounds are normal.  Musculoskeletal: Normal range of motion.  Neurological: She is alert and oriented to person, place, and time.  Skin:  Skin on top of bilateral feet with scattered open areas as well as lichenified skin. Rash in various stages of healing on bilateral hands, arms, elbows, and trunk especially on bilateral sides at the waistline. No drainage noted.  Psychiatric: She has a normal mood and affect. Her behavior is normal.    Diagnostics: Percutaneous skin testing positive to grass pollen, weed pollen, tree pollen and tobacco. Equivocal to seasonal and perennial molds, dust mite, cat and dog.    Assessment and Plan: 1. Seasonal allergic rhinitis due to pollen   2. Anaphylactic shock due to food, initial encounter   3. Atopic dermatitis, unspecified type   4. Other specified hypothyroidism     Patient Instructions  1. Seasonal allergic rhinitis due to pollen Pollen avoidence information provided in written form  Control of Mold Allergen Mold and fungi can grow on a variety of surfaces provided certain temperature and moisture conditions exist.  Outdoor molds grow on plants, decaying vegetation and soil.  The major outdoor mold, Alternaria and Cladosporium, are found in very high numbers during hot and dry conditions.  Generally, a late Summer - Fall peak is seen for common outdoor fungal spores.  Rain will temporarily lower outdoor mold spore count, but counts rise rapidly when the rainy period ends.  The most important indoor molds  are Aspergillus and Penicillium.  Dark, humid and poorly ventilated basements are ideal sites for mold growth.  The next most common sites of mold growth are the bathroom and the kitchen.  Outdoor [employer] 1. Use air conditioning and keep windows closed 2. Avoid exposure to decaying vegetation. 3. Avoid leaf raking. 4. Avoid grain handling. 5. Consider wearing a face mask if working in moldy areas.  Indoor Mold Control 1. Maintain humidity below 50%. 2. Clean washable surfaces with 5% bleach solution. 3. Remove sources e.g. Contaminated carpets.  Continue Claritin as needed for runny nose   2. Anaphylactic shock due to food, initial encounter Carry EpiPen for life threatening allergic reaction  An action plan has been provided in case of a life threatening allergic reaction  3. Atopic dermatitis, unspecified type Return for blood testing for foods identified today  Return on a Monday for patch placement and return for reading on Wednesday and Friday of that same week. Continue to use triamcinolone  cream as needed for itching You may eliminate the following foods from your diet for 1 week to see if your skin clears. Wheat, rice, barley, walnut, almond, hazelnut, and cashew.   4. Other specified hypothyroidism Continue to follow up with primary care provider for thyroid issues   Return in about 5 days (around 02/26/2017), or if symptoms worsen or fail to improve, for Blood test and patch application.   Thank you for the opportunity to care for this patient.  Please do not hesitate to contact me with questions.  Thermon Leyland, FNP  [facility]   Attestation: I performed/discussed a history and physical examination of the patient and discussed management with NP Ambs. I reviewed the NP's note and agree with the documented findings and plan of care. The note in its entirety was edited by myself, including the physical exam, assessment, and plan.    Margo Aye, MD [facility]

## 2017-02-21 NOTE — Telephone Encounter (Signed)
Lm for pt to call us back  

## 2017-02-21 NOTE — Telephone Encounter (Deleted)
Can you please call this patient and tell her the following foods were positive on her skin test today: wheat, cashew, rice, walnut, almond, hazelnut, and barley. She can eliminate these for 1 week to see if her skin will clear. Thank you.

## 2017-02-21 NOTE — Telephone Encounter (Signed)
Patient called back and has been advised of this information.  

## 2017-02-26 ENCOUNTER — Ambulatory Visit: Payer: BLUE CROSS/BLUE SHIELD | Admitting: Allergy

## 2017-02-26 ENCOUNTER — Ambulatory Visit (INDEPENDENT_AMBULATORY_CARE_PROVIDER_SITE_OTHER): Payer: BLUE CROSS/BLUE SHIELD | Admitting: Allergy

## 2017-02-26 DIAGNOSIS — L2084 Intrinsic (allergic) eczema: Secondary | ICD-10-CM | POA: Diagnosis not present

## 2017-02-26 DIAGNOSIS — L2489 Irritant contact dermatitis due to other agents: Secondary | ICD-10-CM | POA: Diagnosis not present

## 2017-02-26 NOTE — Progress Notes (Signed)
    Follow-up Note  RE: [name] MRN: [id number] DOB: 1980-05-16 Date of Office Visit: 02/26/2017  Primary care provider: Laqueta Due., MD Referring provider: Laqueta Due., MD   Amanda Beltran returns to the office today for the patch test placement.  She was last seen in the office on 02/21/17 for initial visit by myself and NP Ambs.  At this visit she was evaluated for eczema and had environmental allergy testing done and found to be sensitized to grasses, weeds, tress, tobacco, molds, dust mite, cat and dog.   For further eczema evaluation it was advised to have patch testing today for which she returns today to determine if there is a contact dermatitis component.   She states she is taking claritin and has been doing better about taking it daily.  She is still rather itchy. She is also using her triamcinolone on inflamed areas.   She states since last visit she did eliminate wheat from diet but is still very itchy and has no noted a decrease in her flares.   Otherwise she is in her normal state of health today.    Diagnostics:  True test patches placed, marked and taped on back today.    Plan:  Atopic dermatitis    - she may have a component of Contact dermatitis keeping her skin inflamed and will assess with patch testing.     - she will return on Wed and Friday for readings    - she will also get her serum IgE testing drawn today as discussed from initial visit    - Advised that she continue at least daily use of her eczema lotion and can mix with equal parts hydrocortisone for daily moisturization.  Continue Triamcinolone as needed for flares.  Advised she can double her Claritin (in AM and PM) for better itch control.    Follow-up in 2 days for patch reading

## 2017-02-28 ENCOUNTER — Ambulatory Visit: Payer: BLUE CROSS/BLUE SHIELD | Admitting: Allergy and Immunology

## 2017-02-28 DIAGNOSIS — L253 Unspecified contact dermatitis due to other chemical products: Secondary | ICD-10-CM

## 2017-02-28 NOTE — Patient Instructions (Signed)
Contact dermatitis due to chemicals Patch tests reveal weak positive reactivity to fragrance mix (#6), formaldehyde (#21), diazolidinyl urea (#25), and mercaptobenzothiazole (#32).  The patient has been provided with written avoidance information for the above listed substances.   Continue to keep back clean and dry.   Follow-up with 2 days for final patch test reading/interpretation.   Return in about 2 days (around 03/02/2017) for final patch test interpretation.

## 2017-02-28 NOTE — Assessment & Plan Note (Signed)
Patch tests reveal weak positive reactivity to fragrance mix (#6), formaldehyde (#21), diazolidinyl urea (#25), and mercaptobenzothiazole (#32).  The patient has been provided with written avoidance information for the above listed substances.   Continue to keep back clean and dry.   Follow-up with 2 days for final patch test reading/interpretation.

## 2017-02-28 NOTE — Progress Notes (Signed)
    Follow-up Note  RE: Annice PihShabeena Beltran MRN: [id number] DOB: 11/01/1980 Date of Office Visit: 02/28/2017  Primary care provider: Laqueta DueFurr, Sara M., MD Referring provider: Laqueta DueFurr, Sara M., MD   [name] returns to the office today for the initial patch test interpretation, given suspected history of contact dermatitis.    Diagnostics:  TRUE TEST 48 hour reading: Few scattered papules with mild erythema (weak positive) to fragrance mix (#6), formaldehyde (#21), diazolidinyl urea (#25), and mercaptobenzothiazole (#32).  Plan:   The patient has been provided with written avoidance information for the above listed substances.   Continue to keep back clean and dry.   Follow-up with 2 days for final patch test reading/interpretation.

## 2017-03-02 ENCOUNTER — Ambulatory Visit: Payer: BLUE CROSS/BLUE SHIELD | Admitting: Allergy & Immunology

## 2017-03-02 DIAGNOSIS — L253 Unspecified contact dermatitis due to other chemical products: Secondary | ICD-10-CM

## 2017-03-02 NOTE — Progress Notes (Signed)
    Follow-up Note  RE: Amanda Beltran MRN: [id number] DOB: 08/09/1980 Date of Office Visit: 03/02/2017  Primary care provider: Laqueta DueFurr, Sara M., MD Referring provider: Laqueta DueFurr, Sara M., MD   [name] returns to the office today for the final patch test interpretation, given suspected history of contact dermatitis.    Diagnostics:   TRUE TEST 96-hour hour reading: mild (few papules) reaction to #21 (Formaldehyde), mild (few papules) reaction to #25 (Diazolidinyl urea) and mild (few papules) reaction to #27 (Tixocortol-21-pivalate)  Plan:   Allergic contact dermatitis - The patient has been provided detailed information regarding the substances she is sensitive to, as well as products containing the substances.   - Meticulous avoidance of these substances is recommended.  - If avoidance is not possible, the use of barrier creams or lotions is recommended. - If symptoms persist or progress despite meticulous avoidance of the above chemicals, Dermatology Referral may be warranted.   Malachi BondsJoel Gallagher, MD FAAAAI [facility]

## 2017-03-05 LAB — ALLERGEN, CHICK PEA, RF309, IGE
